# Patient Record
Sex: Male | Born: 2000 | Race: Black or African American | Hispanic: No | Marital: Single | Smoking: Never smoker
Health system: Southern US, Community
[De-identification: ages and names within clinical notes are randomized; demographics above are authoritative.]

## PROBLEM LIST (undated history)

## (undated) DIAGNOSIS — F840 Autistic disorder: Secondary | ICD-10-CM

---
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
## 2021-06-30 DIAGNOSIS — Y929 Unspecified place or not applicable: Secondary | ICD-10-CM | POA: Insufficient documentation

## 2021-06-30 DIAGNOSIS — R5383 Other fatigue: Secondary | ICD-10-CM | POA: Insufficient documentation

## 2021-06-30 DIAGNOSIS — S41122A Laceration with foreign body of left upper arm, initial encounter: Secondary | ICD-10-CM | POA: Insufficient documentation

## 2021-06-30 DIAGNOSIS — Z79899 Other long term (current) drug therapy: Secondary | ICD-10-CM | POA: Insufficient documentation

## 2021-06-30 DIAGNOSIS — Z639 Problem related to primary support group, unspecified: Secondary | ICD-10-CM | POA: Insufficient documentation

## 2021-06-30 DIAGNOSIS — Y939 Activity, unspecified: Secondary | ICD-10-CM | POA: Insufficient documentation

## 2021-06-30 DIAGNOSIS — Z9151 Personal history of suicidal behavior: Secondary | ICD-10-CM | POA: Insufficient documentation

## 2021-06-30 DIAGNOSIS — Z20822 Contact with and (suspected) exposure to covid-19: Secondary | ICD-10-CM | POA: Insufficient documentation

## 2021-06-30 DIAGNOSIS — F1290 Cannabis use, unspecified, uncomplicated: Secondary | ICD-10-CM | POA: Insufficient documentation

## 2021-06-30 DIAGNOSIS — F840 Autistic disorder: Secondary | ICD-10-CM | POA: Insufficient documentation

## 2021-06-30 DIAGNOSIS — X788XXA Intentional self-harm by other sharp object, initial encounter: Secondary | ICD-10-CM | POA: Insufficient documentation

## 2021-06-30 DIAGNOSIS — F4325 Adjustment disorder with mixed disturbance of emotions and conduct: Secondary | ICD-10-CM | POA: Insufficient documentation
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
## 2021-07-01 ENCOUNTER — Ambulatory Visit (HOSPITAL_COMMUNITY)
Admission: EM | Admit: 2021-07-01 | Discharge: 2021-07-01 | Disposition: A | Discharge status: Home / Self Care | Payer: No Payment, Other | Attending: Student | Admitting: Student

## 2021-07-01 DIAGNOSIS — F840 Autistic disorder: Secondary | ICD-10-CM | POA: Diagnosis not present

## 2021-07-01 DIAGNOSIS — F4325 Adjustment disorder with mixed disturbance of emotions and conduct: Secondary | ICD-10-CM

## 2021-07-01 DIAGNOSIS — Z20822 Contact with and (suspected) exposure to covid-19: Secondary | ICD-10-CM | POA: Diagnosis not present

## 2021-07-01 DIAGNOSIS — R5383 Other fatigue: Secondary | ICD-10-CM | POA: Diagnosis not present

## 2021-07-01 LAB — COMPREHENSIVE METABOLIC PANEL
ALT: 18 U/L (ref 0–44)
AST: 22 U/L (ref 15–41)
Albumin: 4.4 g/dL (ref 3.5–5.0)
Alkaline Phosphatase: 54 U/L (ref 38–126)
Anion gap: 11 (ref 5–15)
BUN: 13 mg/dL (ref 6–20)
CO2: 26 mmol/L (ref 22–32)
Calcium: 8.9 mg/dL (ref 8.9–10.3)
Chloride: 104 mmol/L (ref 98–111)
Creatinine, Ser: 0.9 mg/dL (ref 0.61–1.24)
GFR, Estimated: >60 mL/min (ref >60)
Glucose, Bld: 90 mg/dL (ref 70–99)
Potassium: 3.4 mmol/L — ABNORMAL LOW (ref 3.5–5.1)
Sodium: 141 mmol/L (ref 135–145)
Total Bilirubin: 1.2 mg/dL (ref 0.3–1.2)
Total Protein: 7.2 g/dL (ref 6.5–8.1)

## 2021-07-01 LAB — CBC WITH DIFFERENTIAL/PLATELET
Abs Immature Granulocytes: 0.01 10*3/uL (ref 0.00–0.07)
Basophils Absolute: 0 10*3/uL (ref 0.0–0.1)
Basophils Relative: 0 %
Eosinophils Absolute: 0.2 10*3/uL (ref 0.0–0.5)
Eosinophils Relative: 2 %
HCT: 42.6 % (ref 39.0–52.0)
Hemoglobin: 13.8 g/dL (ref 13.0–17.0)
Immature Granulocytes: 0 %
Lymphocytes Relative: 44 %
Lymphs Abs: 3.3 10*3/uL (ref 0.7–4.0)
MCH: 27.8 pg (ref 26.0–34.0)
MCHC: 32.4 g/dL (ref 30.0–36.0)
MCV: 85.7 fL (ref 80.0–100.0)
Monocytes Absolute: 0.6 10*3/uL (ref 0.1–1.0)
Monocytes Relative: 8 %
Neutro Abs: 3.4 10*3/uL (ref 1.7–7.7)
Neutrophils Relative %: 46 %
Platelets: 133 10*3/uL — ABNORMAL LOW (ref 150–400)
RBC: 4.97 MIL/uL (ref 4.22–5.81)
RDW: 14.1 % (ref 11.5–15.5)
WBC: 7.4 10*3/uL (ref 4.0–10.5)
nRBC: 0 % (ref 0.0–0.2)

## 2021-07-01 LAB — POCT URINE DRUG SCREEN - MANUAL ENTRY (I-SCREEN)
POC Amphetamine UR: NOT DETECTED
POC Buprenorphine (BUP): NOT DETECTED
POC Cocaine UR: NOT DETECTED
POC Secobarbital (BAR): NOT DETECTED

## 2021-07-01 LAB — POCT URINE DRUG SCREEN - MANUAL ENTRY (I-CUP)
POC Marijuana UR: POSITIVE — AB
POC Methadone UR: NOT DETECTED
POC Methamphetamine UR: NOT DETECTED
POC Morphine: NOT DETECTED
POC Oxazepam (BZO): NOT DETECTED
POC Oxycodone UR: NOT DETECTED

## 2021-07-01 LAB — LIPID PANEL
Cholesterol: 224 mg/dL — ABNORMAL HIGH (ref 0–200)
HDL: 55 mg/dL (ref >40)
LDL Cholesterol: 157 mg/dL — ABNORMAL HIGH (ref 0–99)
Total CHOL/HDL Ratio: 4.1 RATIO
Triglycerides: 59 mg/dL (ref <150)
VLDL: 12 mg/dL (ref 0–40)

## 2021-07-01 LAB — HEMOGLOBIN A1C
Hgb A1c MFr Bld: 5.6 % (ref 4.8–5.6)
Mean Plasma Glucose: 114.02 mg/dL

## 2021-07-01 LAB — TSH: TSH: 2.898 u[IU]/mL (ref 0.350–4.500)

## 2021-07-01 LAB — RESP PANEL BY RT-PCR (FLU A&B, COVID) ARPGX2
Influenza A by PCR: NEGATIVE
Influenza B by PCR: NEGATIVE
SARS Coronavirus 2 by RT PCR: NEGATIVE

## 2021-07-01 LAB — POC SARS CORONAVIRUS 2 AG: SARSCOV2ONAVIRUS 2 AG: NEGATIVE

## 2021-07-01 LAB — POC SARS CORONAVIRUS 2 AG -  ED: SARS Coronavirus 2 Ag: NEGATIVE

## 2021-07-01 MED ORDER — TETANUS-DIPHTH-ACELL PERTUSSIS 5-2.5-18.5 LF-MCG/0.5 IM SUSY
0.5000 mL | PREFILLED_SYRINGE | Freq: Once | INTRAMUSCULAR | Status: DC
  Filled 2021-07-01 (×3): qty 0.5

## 2021-07-01 MED ORDER — ALUM & MAG HYDROXIDE-SIMETH 200-200-20 MG/5ML PO SUSP: 30.0000 mL | ORAL | Status: DC | PRN

## 2021-07-01 MED ORDER — ACETAMINOPHEN 325 MG PO TABS: 650.0000 mg | ORAL_TABLET | Freq: Four times a day (QID) | ORAL | Status: DC | PRN

## 2021-07-01 MED ORDER — MAGNESIUM HYDROXIDE 400 MG/5ML PO SUSP: 30.0000 mL | Freq: Every day | ORAL | Status: DC | PRN

## 2021-07-01 MED ORDER — TETANUS-DIPHTHERIA TOXOIDS TD 5-2 LFU IM INJ
0.5000 mL | INJECTION | Freq: Once | INTRAMUSCULAR | Status: DC
  Filled 2021-07-01: qty 0.5

## 2021-07-01 MED ORDER — HYDROXYZINE HCL 25 MG PO TABS: 25.0000 mg | ORAL_TABLET | Freq: Three times a day (TID) | ORAL | Status: DC | PRN

## 2021-07-01 MED ORDER — POTASSIUM CHLORIDE CRYS ER 20 MEQ PO TBCR
20.0000 meq | EXTENDED_RELEASE_TABLET | Freq: Once | ORAL | Status: DC
  Filled 2021-07-01: qty 1

## 2021-07-01 MED ORDER — TRAZODONE HCL 50 MG PO TABS: 50.0000 mg | ORAL_TABLET | Freq: Every evening | ORAL | Status: DC | PRN

## 2021-07-01 NOTE — ED Notes (Signed)
Pt is sleeping in bed. No signs of distress noted. Respirations are even and unlabored. Will continue to monitor for safety. 

## 2021-07-01 NOTE — ED Notes (Signed)
Pt A&O x 4, presents with suicidal ideations after breakup with partner.  Pt admits to attempting to harm self with broken grill, scratches to Left forearm noted.  Pt labile.  Skin search completed.  Monitoring for safety.

## 2021-07-01 NOTE — BH Assessment (Signed)
Pt's aunt, [name], returned clinician's phone call. She states she was with pt yesterday but does not know what happened that would have resulted in him coming to the hospital. She provided the name Delray Alt, pt's aunt, and advised calling her at [phone] for information.

## 2021-07-01 NOTE — ED Notes (Signed)
Pt discharged with  AVS.  AVS reviewed prior to discharge.  Pt alert, oriented, and ambulatory.  Safety maintained. Pt will be going home via safe transport.  Aunt has been notified and pt is aware to provide aunt a call once he has arrived home.

## 2021-07-01 NOTE — Discharge Instructions (Addendum)
Be sure to follow up with the resources provided for outpatient psychiatry and counseling.   In the event of worsening symptoms call the crisis hotline, 911, and or go to the nearest emergency department for appropriate evaluation and treatment of symptoms.  Follow-up with your primary care provider for your medical issues, concerns and or health care needs.

## 2021-07-01 NOTE — ED Provider Notes (Signed)
FBC/OBS ASAP Discharge Summary  Date and Time: 07/01/2021 9:39 AM  Name: Frederick Cooper  MRN:  [id number]   Discharge Diagnoses:  Final diagnoses:  Adjustment disorder with mixed disturbance of emotions and conduct    Subjective: Frederick Cooper is a 20 year old male with documented past medical history of autism but no other apparent significant documented past psychiatric or medical, history, who presents to the [facility] behavioral health urgent care [facility]) voluntarily via Patent examiner. Patient states that he called the police earlier on the evening of 06/30/2021 because he was anxious and having "family issues".  Patient also reports that he has been having anxiety and stress related to a recent break-up with his girlfriend.  Patient also states that he got into a verbal altercation yesterday with his uncle who is his boss, because he did not want to go to work yesterday on 06/30/2021.  He reports that he called the police last night because he "wanted to talk to someone", but he does not initially provide further details regarding why police were called.  Upon further questioning, patient states that yesterday on 06/30/2021, he attempted suicide by cutting his left arm with a piece of metal that he broke in half that was from a grill at his home. He denies SI on exam currently at this time.    Stay Summary:Patient seen and re-examined face to face by this provider, chart reviewed and case discussed with Dr. Lucianne Muss. On evaluation patient is alert and oriented x4. His thought process is logical and speech is coherent. His mood is euthymic and affect is congruent. He denies having thoughts of wanting to hurt himself or others. He verbally contracts for safety and no self-harm behaviors. He reports that yesterday he was  upset after he had an altercation with his family (aunt and uncle) and his girlfriend. He does not provide details on what the altercation was about. He reports making  scratches to his arm and states that he was not trying to kill himself. He denies hearing voices or seeing things that other people cannot hear or see. He does not appear to be responding to internal or external stimuli. He reports sleeping good last night. He reports that his appetite is "alright." He states that he plans to return back home with his aunt and uncle and feels safe. He states that his aunt can pick him up from this facility. He has remained calm and cooperative on the unit without any disruptive, aggressive or self-harm behaviors. He is agreeable to following up with the [facility] outpatient for therapy. Patient provided with resources for outpatient psychiatry.   With the patient's consent, this provider contacted the patient's aunt Shanelle [phone] and uncle Ethelene Browns [phone] via telephone, no response. This provider has communicated with the nursing staff to have the patient family call for safety planning prior to the patient discharging.    With the patient's consent, I spoke to his aunt Willodean Rosenthal [phone]  who states that she has no concerns with the patient being discharged to return back to his uncle's house. She states that his uncle is currently working and is unable to answer his phone. She reports that she is currently at work and is unable to pick the patient. She states that she is agreeable to the facility providing transportation to transport the patient home using our transportation services. She denies that the patient has access to any weapons in home, including guns and knives.  Safety Plan Frederick Cooper will reach out to aunt Hughes Better, call 911 or call mobile crisis if condition worsens or if suicidal thoughts become active Patients' will follow up with [facility] for outpatient psychiatric services (therapy/medication management).  The suicide prevention education provided includes the following: Suicide risk  factors Suicide prevention and interventions National Suicide Hotline telephone number [facility] assessment telephone number [facility] Emergency Assistance 911 [facility] and/or Residential Mobile Crisis Unit telephone number Request made of family/significant other to:  Shanelle [phone]   Remove weapons (e.g., guns, rifles, knives), all items previously/currently identified as safety concern.   Remove drugs/medications (over the counter, prescriptions, illicit drugs), all items previously/currently identified as a safety concern.    Total Time spent with patient: 20 minutes  Past Psychiatric History: hx of autism  Past Medical History:  Past Medical History:  Diagnosis Date   Autism    No past surgical history on file. Family History: No family history on file. Family Psychiatric History:  Social History:  Social History   Substance and Sexual Activity  Alcohol Use No     Social History   Substance and Sexual Activity  Drug Use No    Social History   Socioeconomic History   Marital status: Single    Spouse name: Not on file   Number of children: Not on file   Years of education: Not on file   Highest education level: Not on file  Occupational History   Not on file  Tobacco Use   Smoking status: Never   Smokeless tobacco: Never  Substance and Sexual Activity   Alcohol use: No   Drug use: No   Sexual activity: Not on file  Other Topics Concern   Not on file  Social History Narrative   Not on file   Social Determinants of Health   Financial Resource Strain: Not on file  Food Insecurity: Not on file  Transportation Needs: Not on file  Physical Activity: Not on file  Stress: Not on file  Social Connections: Not on file   SDOH:  SDOH Screenings   Alcohol Screen: Not on file  Depression (PHQ2-9): Low Risk    PHQ-2 Score: 3  Financial Resource Strain: Not on file  Food Insecurity: Not on file  Housing: Not on file   Physical Activity: Not on file  Social Connections: Not on file  Stress: Not on file  Tobacco Use: Low Risk    Smoking Tobacco Use: Never   Smokeless Tobacco Use: Never  Transportation Needs: Not on file    Tobacco Cessation:  Prescription not provided because: pt declined   Current Medications:  Current Facility-Administered Medications  Medication Dose Route Frequency Provider Last Rate Last Admin   acetaminophen (TYLENOL) tablet 650 mg  650 mg Oral Q6H PRN Jaclyn Shaggy, PA-C       alum & mag hydroxide-simeth (MAALOX/MYLANTA) 200-200-20 MG/5ML suspension 30 mL  30 mL Oral Q4H PRN Melbourne Abts W, PA-C       hydrOXYzine (ATARAX/VISTARIL) tablet 25 mg  25 mg Oral TID PRN Melbourne Abts W, PA-C       magnesium hydroxide (MILK OF MAGNESIA) suspension 30 mL  30 mL Oral Daily PRN Ladona Ridgel, Cody W, PA-C       potassium chloride SA (KLOR-CON) CR tablet 20 mEq  20 mEq Oral Once Ladona Ridgel, Cody W, PA-C       Tdap (BOOSTRIX) injection 0.5 mL  0.5 mL Intramuscular Once Jaclyn Shaggy, PA-C  traZODone (DESYREL) tablet 50 mg  50 mg Oral QHS PRN Jaclyn Shaggy, PA-C       No current outpatient medications on file.    PTA Medications: (Not in a hospital admission)   Musculoskeletal  Strength & Muscle Tone: within normal limits Gait & Station: normal Patient leans: N/A  Psychiatric Specialty Exam  Presentation  General Appearance: Appropriate for Environment  Eye Contact:Fair  Speech:Clear and Coherent  Speech Volume:Normal   Mood and Affect  Mood:Euthymic  Affect:Congruent   Thought Process  Thought Processes:Coherent; Goal Directed  Descriptions of Associations:Intact  Orientation:Full (Time, Place and Person)  Thought Content:Logical  Diagnosis of Schizophrenia or Schizoaffective disorder in past: No    Hallucinations:Hallucinations: None  Ideas of Reference:None  Suicidal Thoughts:Suicidal Thoughts: No  Homicidal Thoughts:Homicidal Thoughts: No   Sensorium   Memory:Immediate Fair; Recent Fair; Remote Fair  Judgment:Fair  Insight:Fair   Executive Functions  Concentration:Fair  Attention Span:Fair  Recall:Fair  Fund of Knowledge:Fair  Language:Fair   Psychomotor Activity  Psychomotor Activity:Psychomotor Activity: Normal   Assets  Assets:Communication Skills; Desire for Improvement; Housing; Health and safety inspector; Leisure Time; Intimacy; Physical Health; Social Support   Sleep  Sleep:Sleep: Fair Number of Hours of Sleep: 11   Nutritional Assessment (For OBS and FBC admissions only) Has the patient had a weight loss or gain of 10 pounds or more in the last 3 months?: -- (Patient states he is not sure.) Has the patient had a decrease in food intake/or appetite?: Yes Does the patient have dental problems?: No Does the patient have eating habits or behaviors that may be indicators of an eating disorder including binging or inducing vomiting?: No Has the patient recently lost weight without trying?: 2.0 Has the patient been eating poorly because of a decreased appetite?: 1 Malnutrition Screening Tool Score: 3 Nutritional Assessment Referrals: Refer to Primary Care Provider    Physical Exam  Physical Exam Constitutional:      Appearance: Normal appearance.  HENT:     Head: Atraumatic.     Nose: Nose normal.  Eyes:     Conjunctiva/sclera: Conjunctivae normal.  Cardiovascular:     Rate and Rhythm: Normal rate.  Pulmonary:     Effort: Pulmonary effort is normal.  Musculoskeletal:     Cervical back: Normal range of motion.  Neurological:     Mental Status: He is alert and oriented to person, place, and time.   Review of Systems  Constitutional: Negative.   HENT: Negative.    Eyes: Negative.   Respiratory: Negative.    Cardiovascular: Negative.   Gastrointestinal: Negative.   Genitourinary: Negative.   Musculoskeletal: Negative.   Skin: Negative.   Neurological: Negative.   Endo/Heme/Allergies:  Negative.   Blood pressure 120/65, pulse 67, temperature (!) 97.5 F (36.4 C), temperature source Oral, resp. rate 18, SpO2 99 %. There is no height or weight on file to calculate BMI.  Demographic Factors:  Male and Low socioeconomic status  Loss Factors: Financial problems/change in socioeconomic status  Historical Factors: Impulsivity  Risk Reduction Factors:   Sense of responsibility to family, Employed, Living with another person, especially a relative, and Positive social support  Continued Clinical Symptoms:  Previous Psychiatric Diagnoses and Treatments  Cognitive Features That Contribute To Risk:  None    Suicide Risk:  Minimal: No identifiable suicidal ideation.  Patients presenting with no risk factors but with morbid ruminations; may be classified as minimal risk based on the severity of the depressive symptoms  Plan Of Care/Follow-up recommendations:  Activity:  as tolerated   Disposition: Discharge to home.  Follow-up Information     Go to  [facility].   Specialty: Urgent Care Why: Open access walk-in hours Monday - Thursday 8 am to 11 am for medication management and therapy or call to schedule an appointment. Contact information: [phone] [place] [address] [phone]                Layla Barter, NP 07/01/2021, 9:39 AM

## 2021-07-01 NOTE — ED Provider Notes (Addendum)
Behavioral Health Admission H&P [name])  Date: 07/01/21 Patient Name: Frederick Cooper MRN: [id number] Chief Complaint:  Chief Complaint  Patient presents with   Adjustment Disorder      Diagnoses:  Final diagnoses:  Adjustment disorder with mixed disturbance of emotions and conduct    HPI: Frederick Cooper is a 20 year old male with documented past medical history of autism but no other apparent significant documented past psychiatric or medical, history, who presents to the [facility] behavioral health urgent care [facility]) voluntarily via Event organ[employer]. Patient states that he called the police earlier on the evening of 06/30/2021 because he was anxious and having "family issues".  Patient also reports that he has been having anxiety and stress related to a recent break-up with his girlfriend.  Patient also states that he got into a verbal altercation yesterday with his uncle who is his boss, because he did not want to go to work yesterday on 06/30/2021.  He reports that he called the police last night because he "wanted to talk to someone", but he does not initially provide further details regarding why police were called.  Upon further questioning, patient states that yesterday on 06/30/2021, he attempted suicide by cutting his left arm with a piece of metal that he broke in half that was from a grill at his home. He denies SI on exam currently at this time.  Patient states that he is not sure if his tetanus shot is up-to-date.  He endorses history of 1 additional past suicide attempt at the age of 35 in which he states that he overdosed on prescription medication/sleep medication mixed with alcohol at that time. Prior to this cutting incident, patient states that he had not engaged in self-injurious behavior via cutting since the age of 24.  He denies history of intentionally burning himself.  He denies HI, AVH, paranoia, or delusions.  Patient describes her sleep as poor, but then  states that he sleeps about 11 to 12 hours per night.  He denies anhedonia but does endorse feelings of guilt, hopelessness, and worthlessness.  He endorses fatigue, poor focus/concentration, and decreased appetite.  Patient is unsure if he has had any weight changes over the past few months.  Per chart review, patient was evaluated by TTS on 03/31/2018 When patient presented to [facility] emergency department for psychiatric reasons at that time and patient was psychiatrically cleared at that time with recommendation for outpatient psychiatric follow-up.  Patient states that he is not taking any psychotropic medications or any home medications at this time.  He reports that he has been psychiatrically hospitalized multiple times in the past in which he states his last psychiatric hospitalization was in [place] at the age of 43.  I am unable to find any record of any past psychiatric hospitalizations per my chart review.  Patient states he does not have an outpatient psychiatrist or therapist at this time.  Patient reports that he lives in [place] with his uncle, the mother of his uncle's daughter, and uncle's 32-year-old daughter.  He denies access to firearms or weapons.  He denies alcohol or tobacco/nicotine use.  He endorses smoking an unknown amount of marijuana daily.  He reports that his last marijuana use was on 06/30/2021, in which he states he smoked 3 joints at that time.  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  With patient's consent, myself and [name], LMFT attempted to obtain collateral information by calling  patient's aunt Frederick Cooper: [phone]) and patient's uncle Frederick Cooper: [phone]), but were unsuccessful with reaching either person via phone.  PHQ 2-9:     Total Time spent with patient: 30 minutes  Musculoskeletal  Strength & Muscle Tone: within normal limits Gait & Station: normal Patient leans: N/A  Psychiatric Specialty  Exam  Presentation General Appearance: Appropriate for Environment; Fairly Groomed  Eye Contact:Fair; Fleeting  Speech:Garbled  Speech Volume:Normal  Handedness:No data recorded  Mood and Affect  Mood:Depressed; Anxious  Affect:Congruent   Thought Process  Thought Processes:Coherent; Goal Directed; Linear  Descriptions of Associations:Intact  Orientation:Full (Time, Place and Person)  Thought Content:Logical; WDL    Hallucinations:Hallucinations: None  Ideas of Reference:None  Suicidal Thoughts:Suicidal Thoughts: -- (Patient denies SI currently on exam, but states that he attempted suicide yesterday on 06/30/21 by cutting his left forearm (see HPI for details).)  Homicidal Thoughts:Homicidal Thoughts: No   Sensorium  Memory:Immediate Fair; Recent Fair; Remote Fair  Judgment:Fair  Insight:Shallow   Executive Functions  Concentration:Fair  Attention Span:Fair  [place]   Psychomotor Activity  Psychomotor Activity:Psychomotor Activity: Normal   Assets  Assets:Communication Skills; Desire for Improvement; Financial Resources/Insurance; Housing; Leisure Time; Physical Health; Resilience; Social Support; Vocational/Educational   Sleep  Sleep:Sleep: Poor Number of Hours of Sleep: 11   Nutritional Assessment (For OBS and [facility]BC admissions only) Has the patient had a weight loss or gain of 10 pounds or more in the last 3 months?: -- (Patient states he is not sure.) Has the patient had a decrease in food intake/or appetite?: Yes Does the patient have dental problems?: No Does the patient have eating habits or behaviors that may be indicators of an eating disorder including binging or inducing vomiting?: No Has the patient recently lost weight without trying?: 2.0 Has the patient been eating poorly because of a decreased appetite?: 1 Malnutrition Screening Tool Score: 3 Nutritional Assessment Referrals: Refer to  Primary Care Provider    Physical Exam Vitals reviewed.  Constitutional:      General: He is not in acute distress.    Appearance: He is not ill-appearing, toxic-appearing or diaphoretic.  HENT:     Head: Normocephalic and atraumatic.     Right Ear: External ear normal.     Left Ear: External ear normal.     Nose: Nose normal.  Eyes:     General:        Right eye: No discharge.        Left eye: No discharge.     Conjunctiva/sclera: Conjunctivae normal.  Cardiovascular:     Rate and Rhythm: Normal rate.  Pulmonary:     Effort: Pulmonary effort is normal. No respiratory distress.  Musculoskeletal:        General: Normal range of motion.     Cervical back: Normal range of motion.  Skin:    Comments: Multiple superficial lacerations noted on patient's left forearm, with a few of these lesions appearing to some slight bleeding. No signs of surrounding inflammation, drainage, or infection noted with these lacerations.  Neurological:     General: No focal deficit present.     Mental Status: He is alert and oriented to person, place, and time.     Comments: No tremor noted.   Psychiatric:        Attention and Perception: He does not perceive auditory or visual hallucinations.        Mood and Affect: Mood is anxious and depressed.  Behavior: Behavior is not agitated, slowed, aggressive, withdrawn, hyperactive or combative. Behavior is cooperative.        Thought Content: Thought content is not paranoid or delusional. Thought content does not include homicidal ideation.     Comments: Affect mood congruent. Patient states that yesterday on 06/30/2021, he attempted suicide by cutting his left arm with a piece of metal that he broke in half that was from a grill at his home. He denies SI on exam currently at this time.     Review of Systems  Constitutional:  Positive for malaise/fatigue. Negative for chills, diaphoresis and fever.       Patient unsure of weight changes.   HENT:   Negative for congestion.   Respiratory:  Negative for cough and shortness of breath.   Cardiovascular:  Negative for chest pain and palpitations.  Gastrointestinal:  Negative for abdominal pain, constipation, diarrhea, nausea and vomiting.  Musculoskeletal:  Negative for joint pain and myalgias.  Neurological:  Negative for dizziness, seizures and headaches.  Psychiatric/Behavioral:  Positive for depression, substance abuse and suicidal ideas. Negative for hallucinations and memory loss. The patient is nervous/anxious and has insomnia.   All other systems reviewed and are negative.  Vitals: Blood pressure 120/72, pulse 63, temperature 98.5 F (36.9 C), temperature source Oral, resp. rate 18, SpO2 98 %. There is no height or weight on file to calculate BMI.  Past Psychiatric History: See HPI   Is the patient at risk to self? Yes  Has the patient been a risk to self in the past 6 months? No .    Has the patient been a risk to self within the distant past? Yes   Is the patient a risk to others? No   Has the patient been a risk to others in the past 6 months? No   Has the patient been a risk to others within the distant past? No   Past Medical History:  Past Medical History:  Diagnosis Date   Autism    No past surgical history on file.  Family History: No family history on file.  Social History:  Social History   Socioeconomic History   Marital status: Single    Spouse name: Not on file   Number of children: Not on file   Years of education: Not on file   Highest education level: Not on file  Occupational History   Not on file  Tobacco Use   Smoking status: Never   Smokeless tobacco: Never  Substance and Sexual Activity   Alcohol use: No   Drug use: No   Sexual activity: Not on file  Other Topics Concern   Not on file  Social History Narrative   Not on file   Social Determinants of Health   Financial Resource Strain: Not on file  Food Insecurity: Not on file   Transportation Needs: Not on file  Physical Activity: Not on file  Stress: Not on file  Social Connections: Not on file  Intimate Partner Violence: Not on file    SDOH:  SDOH Screenings   Alcohol Screen: Not on file  Depression (PHQ2-9): Not on file  Financial Resource Strain: Not on file  Food Insecurity: Not on file  Housing: Not on file  Physical Activity: Not on file  Social Connections: Not on file  Stress: Not on file  Tobacco Use: Low Risk    Smoking Tobacco Use: Never   Smokeless Tobacco Use: Never  Transportation Needs: Not on file  Last Labs:  No visits with results within 6 Month(s) from this visit.  Latest known visit with results is:  Admission on 06/01/2018, Discharged on 06/02/2018  Component Date Value Ref Range Status   WBC 06/01/2018 13.5  4.5 - 13.5 K/uL Final   RBC 06/01/2018 4.85  3.80 - 5.70 MIL/uL Final   Hemoglobin 06/01/2018 13.3  12.0 - 16.0 g/dL Final   HCT 06/01/2018 42.1  36.0 - 49.0 % Final   MCV 06/01/2018 86.8  78.0 - 98.0 fL Final   MCH 06/01/2018 27.4  25.0 - 34.0 pg Final   MCHC 06/01/2018 31.6  31.0 - 37.0 g/dL Final   RDW 06/01/2018 13.5  11.4 - 15.5 % Final   Platelets 06/01/2018 164  150 - 400 K/uL Final   Neutrophils Relative % 06/01/2018 56  % Final   Neutro Abs 06/01/2018 7.5  1.7 - 8.0 K/uL Final   Lymphocytes Relative 06/01/2018 34  % Final   Lymphs Abs 06/01/2018 4.6  1.1 - 4.8 K/uL Final   Monocytes Relative 06/01/2018 8  % Final   Monocytes Absolute 06/01/2018 1.0  0.2 - 1.2 K/uL Final   Eosinophils Relative 06/01/2018 2  % Final   Eosinophils Absolute 06/01/2018 0.2  0.0 - 1.2 K/uL Final   Basophils Relative 06/01/2018 0  % Final   Basophils Absolute 06/01/2018 0.1  0.0 - 0.1 K/uL Final   Immature Granulocytes 06/01/2018 0  % Final   Abs Immature Granulocytes 06/01/2018 0.1  0.0 - 0.1 K/uL Final   Performed at [facility], [place] [address] [place], [place] [address]   Sodium 06/01/2018 138  135 - 145 mmol/L  Final   Potassium 06/01/2018 3.1 (A) 3.5 - 5.1 mmol/L Final   Chloride 06/01/2018 102  98 - 111 mmol/L Final   CO2 06/01/2018 22  22 - 32 mmol/L Final   Glucose, Bld 06/01/2018 132 (A) 70 - 99 mg/dL Final   BUN 06/01/2018 14  4 - 18 mg/dL Final   Creatinine, Ser 06/01/2018 1.00  0.50 - 1.00 mg/dL Final   Calcium 06/01/2018 8.6 (A) 8.9 - 10.3 mg/dL Final   GFR calc non Af Amer 06/01/2018 NOT CALCULATED  >60 mL/min Final   GFR calc Af Amer 06/01/2018 NOT CALCULATED  >60 mL/min Final   Comment: (NOTE) The eGFR has been calculated using the CKD EPI equation. This calculation has not been validated in all clinical situations. eGFR's persistently <60 mL/min signify possible Chronic Kidney Disease.    Anion gap 06/01/2018 14  5 - 15 Final   Performed at [place] [address] [place], [place] [address]   ABO/RH(D) 06/01/2018 A POS   Final   Antibody Screen 06/01/2018 NEG   Final   Sample Expiration 06/01/2018    Final                   Value:06/04/2018 Performed at [facility], [place] [address] [place], [place] [address]    ABO/RH(D) 06/01/2018    Final                   Value:A POS Performed at [facility], [place] [address], [place], [place] [address]     Allergies: Patient has no known allergies.  PTA Medications: (Not in a hospital admission)   Medical Decision Making  Patient is a 20 year old male with past psychiatric and medical history as stated above who presents to the [facility] via law  enforcement for reported suicide attempt via cutting his left forearm on 06/30/2021 as well as increased anxiety related to family issues and a recent break-up with his girlfriend, as well as a reported verbal altercation that he had with his uncle on 06/30/2021.  Based on patient's current presentation including the details noted above as well as inability to obtain collateral information from family members at this time, believe that the patient is a potential threat to  himself at this time and recommend continuous assessment for the patient at this time.    Recommendations  Based on my evaluation the patient does not appear to have an emergency medical condition.  Patient will be placed in [facility] continuous assessment for further stabilization and treatment.  Patient will be reevaluated by the treatment team on 06/30/2021 and disposition to be determined at that time.   Due to patient being unsure of tetanus immunization status and due to patient reporting that he cut his left forearm with a piece of metal from a grill, order for tetanus (Boostrix) vaccine placed.  Labs ordered and reviewed:  -PCR Flu A&B, COVID: Negative  -UDS: Patient unable to provide urine specimen at this time.  Recommend that the treatment team encouraged the patient to provide urine specimen on 07/01/2021  -CBC with differential: Platelet count slightly reduced at 133 K/uL.  CBC otherwise unremarkable  -CMP: Mild hypokalemia noted with potassium slightly reduced at 3.4 mmol/L.  CMP otherwise unremarkable.  -Hemoglobin A1c: Within normal limits at 5.6%  -TSH: Within normal limits  -Lipid panel: Total cholesterol elevated at 224 mg/dL, LDL cholesterol elevated at 157 mg/dL.  Lipid panel otherwise unremarkable.  Patient reports he is not taking any other medications at this time.  Vistaril 25 mg p.o. 3 times daily as needed ordered for anxiety. Trazodone 50 mg p.o. at bedtime as needed ordered for sleep. Patient educated on side effect of Vistaril and trazodone.  One time dose of PO potassium 20 mEq ordered to address patient's mild hypokalemia.  Prescilla Sours, PA-C 07/01/21  12:55 AM

## 2021-07-01 NOTE — ED Notes (Signed)
Pt given lunch

## 2021-07-04 ENCOUNTER — Other Ambulatory Visit: Payer: Self-pay

## 2021-07-04 ENCOUNTER — Ambulatory Visit (HOSPITAL_COMMUNITY)
Admission: EM | Admit: 2021-07-04 | Discharge: 2021-07-05 | Disposition: A | Discharge status: Home / Self Care | Payer: No Payment, Other | Attending: Nurse Practitioner | Admitting: Nurse Practitioner

## 2021-07-04 DIAGNOSIS — Z5900 Homelessness unspecified: Secondary | ICD-10-CM | POA: Insufficient documentation

## 2021-07-04 DIAGNOSIS — F4323 Adjustment disorder with mixed anxiety and depressed mood: Secondary | ICD-10-CM | POA: Diagnosis not present

## 2021-07-04 NOTE — Progress Notes (Signed)
   07/04/21 2012  Patient Reported Information  How Did You Hear About [place]? Legal System  What Is the Reason for Your Visit/Call Today? Pt reports, he has fleeting suicidal ideations daily with a plan to cutting his wrist.  How Long Has This Been Causing You Problems? <Week  What Do You Feel Would Help You the Most Today? Treatment for Depression or other mood problem;Social Support  Have You Recently Had Any Thoughts About Hurting Yourself? Yes  Are You Planning to Commit Suicide/Harm Yourself At This time? Yes  Have you Recently Had Thoughts About Hurting Someone [name]? No  Are You Planning To Harm Someone At This Time? No  Have You Used Any Alcohol or Drugs in the Past 24 Hours? Yes  What Did You Use and How Much? Marijuana.  Do You Currently Have a Therapist/Psychiatrist? No  [facility] Screening Triage Referral Assessment  Type of Contact Face-to-Face  Location of Assessment [facility] Assessment Services  Provider location [facility] Assessment Services  Collateral Involvement Clinician attempted to contact pt's legal guardian/uncle, [name], [phone] and pt's aunt, [name], at [phone] was both unsuccessful.  If Minor and Not Living with Parent(s), Who has Custody? N/A  Patient Determined To Be At Risk for Harm To Self or Others Based on Review of Patient Reported Information or Presenting Complaint? Yes, for Self-Harm  Does Patient Present under Involuntary Commitment? No  [place] of Residence [place]  Patient Currently Receiving the Following Services: Not Receiving Services  Determination of Need Urgent (48 hours)  Options For Referral [facility];Inpatient Hospitalization   Pratik Dalziel is a 20 year old male who presents depressed with fleeting suicidal ideations. Pt reports, having plan of cutting his wrist. Pt reports, he recently broke up with his girlfriend and was kicked out of his uncle's house for no going to work. Pt reports, he can't contract for safety.     Determination of need: Urgent.   Redmond Pulling, MS, [facility], [facility] Triage Specialist [phone]

## 2021-07-04 NOTE — ED Notes (Signed)
Called [employer] for transport. 

## 2021-07-04 NOTE — BH Assessment (Signed)
Comprehensive Clinical Assessment (CCA) Note  07/04/2021 Frederick Cooper [id number]  Disposition: Frederick Cooper, PMHNP recommends pt is psych cleared.   Flowsheet Row ED from 07/04/2021 in [facility] ED from 07/01/2021 in [facility]  C-SSRS RISK CATEGORY High Risk High Risk      The patient demonstrates the following risk factors for suicide: Chronic risk factors for suicide include: N/A and previous suicide attempts Adjustment disorder with mixed disturbance of emotions and conduct . Acute risk factors for suicide include: family or marital conflict and unemployment. Protective factors for this patient include:  None . Considering these factors, the overall suicide risk at this point appears to be high. Patient is not appropriate for outpatient follow up.  Frederick Cooper is a 20 year old male who presents voluntary and unaccompanied to [facility]. Clinician asked the pt, "what brought you to the hospital?" Pt reports, he's depressed, feeling like a worse person from his family and girlfriend. Pt's reports the following stressors: he recently broke up with his girlfriend, last Thursday he didn't to go to work, he was fired by his uncle and kicked out of the house. Pt reports, he's been homeless staying outside. 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 Pt reports, he moved from [place] this summer to live with his (paternal) uncle because he wasn't getting along with his mother. Pt reports, he's been having fleeting suicidal ideations everyday with a plan to take a knife and cut his wrist. Pt reports, he called his grandmother and expressed he wanted to get better. Pt reports, last week he used a metal piece of a drill to cut his left forearm. Pt denies, HI, AVH, access to weapons.    Pt reports, he's not smoked marijuana "in a minute." Pt's UDS is pending. Pt denies, being linked to OPT resources (medication management and/or counseling.) Pt  reports, previous inpatient admissions.   Pt presents alert with slurred speech at times. Pt's mood was depressed, anxious. Pt's affect was flat. Pt's insight was lacking. Pt's judgement was poor. Pt reports, if discharged he would hurt himself. Clinician discussed the three possible dispositions (discharged with OPT resources, observe/reassess by psychiatry or inpatient treatment) in detail.   Diagnosis: Adjustment Disorder with mixed disturbance of emotions and conduct.   *Clinician attempted to call pt's legal guardian/uncle, Frederick Cooper, [phone] clinician was unable to leave a HIPPA voice message.  Pt consented for clinician to aunt his aunt Frederick Cooper, [phone] however the call was unsuccessful, clinician received the message: "call can't be completed as this time please try your call again later."*  Chief Complaint: No chief complaint on file.  Visit Diagnosis:     CCA Screening, Triage and Referral (STR)  Patient Reported Information How did you hear about [place]? Legal System  What Is the Reason for Your Visit/Call Today? Pt reports, he has fleeting suicidal ideations daily with a plan to cutting his wrist.  How Long Has This Been Causing You Problems? <Week  What Do You Feel Would Help You the Most Today? Treatment for Depression or other mood problem; Social Support   Have You Recently Had Any Thoughts About Hurting Yourself? Yes  Are You Planning to Commit Suicide/Harm Yourself At This time? Yes   Have you Recently Had Thoughts About Hurting Someone Frederick Cooper? No  Are You Planning to Harm Someone at This Time? No  Explanation: No data recorded  Have You Used Any Alcohol or Drugs in the Past 24 Hours? Yes  How Long Ago Did You Use Drugs or Alcohol? No data recorded What Did You Use and How Much? Marijuana.   Do You Currently Have a Therapist/Psychiatrist? No  Name of Therapist/Psychiatrist: No data recorded  Have You Been Recently Discharged From Any  Office Practice or Programs? No  Explanation of Discharge From Practice/Program: No data recorded    CCA Screening Triage Referral Assessment Type of Contact: Face-to-Face  Telemedicine Service Delivery:   Is this Initial or Reassessment? No data recorded Date Telepsych consult ordered in CHL:  No data recorded Time Telepsych consult ordered in CHL:  No data recorded Location of Assessment: [facility] Assessment Services  Provider Location: [facility] Assessment Services   Collateral Involvement: Clinician attempted to contact pt's legal guardian/uncle, Frederick Cooper, [phone] and pt's aunt, Frederick Cooper, at [phone] was both unsuccessful.   Does Patient Have a Automotive engineer Guardian? No data recorded Name and Contact of Legal Guardian: No data recorded If Minor and Not Living with Parent(s), Who has Custody? N/A  Is CPS involved or ever been involved? -- (Unknown)  Is APS involved or ever been involved? -- (Unknown)   Patient Determined To Be At Risk for Harm To Self or Others Based on Review of Patient Reported Information or Presenting Complaint? Yes, for Self-Harm  Method: No data recorded Availability of Means: No data recorded Intent: No data recorded Notification Required: No data recorded Additional Information for Danger to Others Potential: No data recorded Additional Comments for Danger to Others Potential: No data recorded Are There Guns or Other Weapons in Your Home? No data recorded Types of Guns/Weapons: No data recorded Are These Weapons Safely Secured?                            No data recorded Who Could Verify You Are Able To Have These Secured: No data recorded Do You Have any Outstanding Charges, Pending Court Dates, Parole/Probation? No data recorded Contacted To Inform of Risk of Harm To Self or Others: -- (N/A)    Does Patient Present under Involuntary Commitment? No  IVC Papers Initial File Date: No data recorded  [place] of  Residence: [place]   Patient Currently Receiving the Following Services: Not Receiving Services   Determination of Need: Urgent (48 hours)   Options For Referral: [facility]; Inpatient Hospitalization     CCA Biopsychosocial Patient Reported Schizophrenia/Schizoaffective Diagnosis in Past: No   Strengths: Pt is employed. He is polite. He likes to listen to music with his girlfriend.   Mental Health Symptoms Depression:   Difficulty Concentrating; Irritability; Tearfulness; Hopelessness; Worthlessness; Fatigue; Increase/decrease in appetite   Duration of Depressive symptoms:  Duration of Depressive Symptoms: Less than two weeks   Mania:   None   Anxiety:    Worrying; Irritability; Fatigue   Psychosis:   None   Duration of Psychotic symptoms:    Trauma:   None   Obsessions:   None   Compulsions:   None   Inattention:   None   Hyperactivity/Impulsivity:   Feeling of restlessness   Oppositional/Defiant Behaviors:   None   Emotional Irregularity:   Potentially harmful impulsivity   Other Mood/Personality Symptoms:   None noted    Mental Status Exam Appearance and self-care  Stature:   Small   Weight:   Average weight   Clothing:   Casual; Age-appropriate   Grooming:   Normal   Cosmetic use:   None  Posture/gait:   Normal   Motor activity:   Not Remarkable   Sensorium  Attention:   Normal   Concentration:   Normal   Orientation:   X5   Recall/memory:   Normal (UTA)   Affect and Mood  Affect:   Flat   Mood:   Anxious   Relating  Eye contact:   Normal   Facial expression:   Responsive   Attitude toward examiner:   Cooperative   Thought and Language  Speech flow:  Slurred   Thought content:   Appropriate to Mood and Circumstances   Preoccupation:   None   Hallucinations:   None   Organization:  No data recorded  [employer] of Knowledge:   Fair   Intelligence:   Needs  investigation   Abstraction:   Normal   Judgement:   Normal   Reality Testing:   Distorted   Insight:   Gaps   Decision Making:   Impulsive   Social Functioning  Social Maturity:   Impulsive   Social Judgement:   Naive   Stress  Stressors:   Family conflict; Housing; Relationship; Transitions   Coping Ability:   Deficient supports; Overwhelmed   Skill Deficits:   Decision making; Self-control   Supports:   Support needed     Religion: Religion/Spirituality Are You A Religious Person?: No  Leisure/Recreation: Leisure / Recreation Do You Have Hobbies?: Yes Leisure and Hobbies: Chill, smoke, talk girlfriend on the phone or see her.  Exercise/Diet: Exercise/Diet Do You Exercise?:  (Pushups.) Have You Gained or Lost A Significant Amount of Weight in the Past Six Months?: No Do You Follow a Special Diet?: No Do You Have Any Trouble Sleeping?: Yes Explanation of Sleeping Difficulties: Pt reports, he went to sleep from 3a-5a (between 5 or 6 hours.)   CCA Employment/Education Employment/Work Situation: Employment / Work Situation Employment Situation: Unemployed Work Stressors: Pt reports, he worked and lives his uncle (legal guardian) but was fired (by his uncle) last Thursday beacuse he didn't want to go to work that day. Has Patient ever Been in the [employer]?: No  Education: Education Is Patient Currently Attending School?: No Last Grade Completed: 10 Did You Attend College?: No   CCA Family/Childhood History Family and Relationship History: Family history Marital status: Single Does patient have children?: No  Childhood History:  Childhood History By whom was/is the patient raised?: Other (Comment) (UTA) Did patient suffer any verbal/emotional/physical/sexual abuse as a child?: No Has patient ever been sexually abused/assaulted/raped as an adolescent or adult?: No Witnessed domestic violence?: No Has patient been affected by domestic  violence as an adult?:  (NA)  Child/Adolescent Assessment:     CCA Substance Use Alcohol/Drug Use: Alcohol / Drug Use Pain Medications: See MAR Prescriptions: See MAR Over the Counter: See MAR     ASAM's:  Six Dimensions of Multidimensional Assessment  Dimension 1:  Acute Intoxication and/or Withdrawal Potential:      Dimension 2:  Biomedical Conditions and Complications:      Dimension 3:  Emotional, Behavioral, or Cognitive Conditions and Complications:     Dimension 4:  Readiness to Change:     Dimension 5:  Relapse, Continued use, or Continued Problem Potential:     Dimension 6:  Recovery/Living Environment:     ASAM Severity Score:    ASAM Recommended Level of Treatment:     Substance use Disorder (SUD)    Recommendations for Services/Supports/Treatments:    Discharge Disposition:  DSM5 Diagnoses: There are no problems to display for this patient.    Referrals to Alternative Service(s): Referred to Alternative Service(s):   Place:   Date:   Time:    Referred to Alternative Service(s):   Place:   Date:   Time:    Referred to Alternative Service(s):   Place:   Date:   Time:    Referred to Alternative Service(s):   Place:   Date:   Time:     Redmond Pulling, [facility] Comprehensive Clinical Assessment (CCA) Screening, Triage and Referral Note  07/04/2021 Frederick Cooper [id number]  Chief Complaint: No chief complaint on file.  Visit Diagnosis:   Patient Reported Information How did you hear about [place]? Legal System  What Is the Reason for Your Visit/Call Today? Pt reports, he has fleeting suicidal ideations daily with a plan to cutting his wrist.  How Long Has This Been Causing You Problems? <Week  What Do You Feel Would Help You the Most Today? Treatment for Depression or other mood problem; Social Support   Have You Recently Had Any Thoughts About Hurting Yourself? Yes  Are You Planning to Commit Suicide/Harm Yourself At This time? Yes   Have  you Recently Had Thoughts About Hurting Someone Frederick Cooper? No  Are You Planning to Harm Someone at This Time? No  Explanation: No data recorded  Have You Used Any Alcohol or Drugs in the Past 24 Hours? Yes  How Long Ago Did You Use Drugs or Alcohol? No data recorded What Did You Use and How Much? Marijuana.   Do You Currently Have a Therapist/Psychiatrist? No  Name of Therapist/Psychiatrist: No data recorded  Have You Been Recently Discharged From Any Office Practice or Programs? No  Explanation of Discharge From Practice/Program: No data recorded   CCA Screening Triage Referral Assessment Type of Contact: Face-to-Face  Telemedicine Service Delivery:   Is this Initial or Reassessment? No data recorded Date Telepsych consult ordered in CHL:  No data recorded Time Telepsych consult ordered in CHL:  No data recorded Location of Assessment: [facility] Assessment Services  Provider Location: [facility] Assessment Services   Collateral Involvement: Clinician attempted to contact pt's legal guardian/uncle, Frederick Cooper, [phone] and pt's aunt, Frederick Cooper, at [phone] was both unsuccessful.   Does Patient Have a Automotive engineer Guardian? No data recorded Name and Contact of Legal Guardian: No data recorded If Minor and Not Living with Parent(s), Who has Custody? N/A  Is CPS involved or ever been involved? -- (Unknown)  Is APS involved or ever been involved? -- (Unknown)   Patient Determined To Be At Risk for Harm To Self or Others Based on Review of Patient Reported Information or Presenting Complaint? Yes, for Self-Harm  Method: No data recorded Availability of Means: No data recorded Intent: No data recorded Notification Required: No data recorded Additional Information for Danger to Others Potential: No data recorded Additional Comments for Danger to Others Potential: No data recorded Are There Guns or Other Weapons in Your Home? No data recorded Types of  Guns/Weapons: No data recorded Are These Weapons Safely Secured?                            No data recorded Who Could Verify You Are Able To Have These Secured: No data recorded Do You Have any Outstanding Charges, Pending Court Dates, Parole/Probation? No data recorded Contacted To Inform of Risk of Harm To Self or Others: -- (N/A)  Does Patient Present under Involuntary Commitment? No  IVC Papers Initial File Date: No data recorded  [place] of Residence: [place]   Patient Currently Receiving the Following Services: Not Receiving Services   Determination of Need: Urgent (48 hours)   Options For Referral: [facility] Urgent Care; Inpatient Hospitalization   Discharge Disposition:     Redmond Pulling, [facility]     Redmond Pulling, MS, [facility], [facility] Triage Specialist [phone]

## 2021-07-04 NOTE — Discharge Instructions (Signed)
  Discharge recommendations:  Patient is to take medications as prescribed. Please see information for follow-up appointment with psychiatry and therapy. Please follow up with your primary care provider for all medical related needs.   Therapy: We recommend that patient participate in individual therapy to address mental health concerns.  Atypical antipsychotics: If you are prescribed an atypical antipsychotic, it is recommended that your height, weight, BMI, blood pressure, fasting lipid panel, and fasting blood sugar be monitored by your outpatient providers.  Safety:  The patient should abstain from use of illicit substances/drugs and abuse of any medications. If symptoms worsen or do not continue to improve or if the patient becomes actively suicidal or homicidal then it is recommended that the patient return to the closest hospital emergency department, the [facility] or call 911 for further evaluation and treatment.  Therapy Walk-in Hours  Monday-Wednesday: 8 AM until slots are full  Friday: 1 PM to 5 PM  For Monday to Wednesday, it is recommended that patients arrive between 7:30 AM and 7:45 AM because patients will be seen in the order of arrival.  For Friday, we ask that patients arrive between 12 PM to 12:30 PM.  Go to the second floor on arrival and check in.  **Availability is limited; therefore, patients may not be seen on the same day.**  Medication management walk-ins:  Monday to Friday: 8 AM to 11 AM.  It is recommended that patients arrive by 7:30 AM to 7:45 AM because patients will be seen in the order of arrival.  Go to the second floor on arrival and check in.  **Availability is limited; therefore, patients may not be seen on the same day.**  National Suicide Prevention Lifeline 1-800-SUICIDE or (281)642-3754.  About 988 988 offers 24/7 access to trained crisis counselors who can help people experiencing mental health-related  distress. People can call or text 988 or chat 988lifeline.org for themselves or if they are worried about a loved one who may need crisis support.

## 2021-07-04 NOTE — ED Provider Notes (Signed)
Behavioral Health Urgent Care Medical Screening Exam  Patient Name: Frederick Cooper MRN: [id number] Date of Evaluation: 07/04/21 Chief Complaint:   Diagnosis:  Final diagnoses:  Adjustment disorder with mixed anxiety and depressed mood    History of Present illness: Frederick Cooper is a 20 y.o. male who presents to [facility] voluntarily. Patient requests assistance for depression and anxiety. 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 Patient denies suicidal  ideations. He denies homicidal ideations. He denies auditory and visual hallucinations. He reports occasional use of marijuana. Denies use of other substances.    *Clinician attempted to call pt's legal guardian/uncle, Frederick Cooper, [phone] clinician was unable to leave a HIPPA voice message.  Pt consented for clinician to aunt his aunt Frederick Cooper, [phone] however the call was unsuccessful, clinician received the message: "call can't be completed as this time please try your call again later."*  Psychiatric Specialty Exam  Presentation  General Appearance:Appropriate for Environment  Eye Contact:Fair  Speech:Clear and Coherent; Normal Rate  Speech Volume:Normal  Handedness:No data recorded  Mood and Affect  Mood:Depressed  Affect:Congruent   Thought Process  Thought Processes:Coherent; Goal Directed  Descriptions of Associations:Intact  Orientation:Full (Time, Place and Person)  Thought Content:Logical  Diagnosis of Schizophrenia or Schizoaffective disorder in past: No   Hallucinations:None  Ideas of Reference:None  Suicidal Thoughts:No  Homicidal Thoughts:No   Sensorium  Memory:Immediate Good  Judgment:Fair  Insight:Fair   Executive Functions  Concentration:Fair  Attention Span:Fair  Recall:Fair  Fund of Knowledge:Fair  Language:Good   Psychomotor Activity   Psychomotor Activity:Normal   Assets  Assets:Communication Skills; Desire for Improvement; Financial Resources/Insurance; Housing; Physical Health; Social Support   Sleep  Sleep:Fair  Number of hours: 11   No data recorded  Physical Exam: Physical Exam Constitutional:      General: He is not in acute distress.    Appearance: He is not ill-appearing, toxic-appearing or diaphoretic.  HENT:     Head: Normocephalic.     Right Ear: External ear normal.     Left Ear: External ear normal.  Eyes:     Pupils: Pupils are equal, round, and reactive to light.  Cardiovascular:     Rate and Rhythm: Normal rate.  Pulmonary:     Effort: Pulmonary effort is normal. No respiratory distress.  Musculoskeletal:        General: Normal range of motion.  Skin:    General: Skin is warm and dry.  Neurological:     Mental Status: He is alert and oriented to person, place, and time.  Psychiatric:        Mood and Affect: Mood is depressed.        Speech: Speech normal.        Behavior: Behavior is cooperative.        Thought Content: Thought content does not include homicidal or suicidal ideation. Thought content does not include suicidal plan.   Review of Systems  Constitutional:  Negative for chills, diaphoresis, fever, malaise/fatigue and weight loss.  HENT:  Negative for congestion.   Respiratory:  Negative for cough and shortness of breath.   Cardiovascular:  Negative for chest pain and palpitations.  Gastrointestinal:  Negative for diarrhea, nausea and vomiting.  Neurological:  Negative for dizziness and seizures.  Psychiatric/Behavioral:  Positive for depression. Negative for hallucinations, memory loss and suicidal ideas. The patient is nervous/anxious and has insomnia.  All other systems reviewed and are negative.  Blood pressure 119/76, pulse 73, temperature 98.2 F (36.8 C), temperature source Oral, resp. rate 18, SpO2 97 %. There is no height or weight on file to calculate  BMI.  Musculoskeletal: Strength & Muscle Tone: within normal limits Gait & Station: normal Patient leans: N/A   [facility] MSE Discharge Disposition for Follow up and Recommendations: Based on my evaluation the patient does not appear to have an emergency medical condition and can be discharged with resources and follow up care in outpatient services for Medication Management and Individual Therapy  Provided with walk-in hours for medication management and therapy.    Jackelyn Poling, NP 07/04/2021, 10:08 PM

## 2021-07-06 ENCOUNTER — Telehealth (HOSPITAL_COMMUNITY): Payer: Self-pay | Admitting: Pediatrics

## 2021-07-06 NOTE — BH Assessment (Addendum)
Care Management - Follow Up [facility] Discharges   Writer attempted to make contact with patient today and was unsuccessful.  Writer was not able to leave a HIPPA compliant voice message.

## 2022-02-01 ENCOUNTER — Ambulatory Visit (HOSPITAL_COMMUNITY)
Admission: EM | Admit: 2022-02-01 | Discharge: 2022-02-01 | Disposition: A | Discharge status: Home / Self Care | Payer: No Payment, Other | Attending: Psychiatry | Admitting: Psychiatry

## 2022-02-01 DIAGNOSIS — F1090 Alcohol use, unspecified, uncomplicated: Secondary | ICD-10-CM | POA: Insufficient documentation

## 2022-02-01 DIAGNOSIS — F1990 Other psychoactive substance use, unspecified, uncomplicated: Secondary | ICD-10-CM

## 2022-02-01 DIAGNOSIS — F840 Autistic disorder: Secondary | ICD-10-CM

## 2022-02-01 DIAGNOSIS — Z653 Problems related to other legal circumstances: Secondary | ICD-10-CM | POA: Insufficient documentation

## 2022-02-01 DIAGNOSIS — Z9151 Personal history of suicidal behavior: Secondary | ICD-10-CM | POA: Insufficient documentation

## 2022-02-01 DIAGNOSIS — R4588 Nonsuicidal self-harm: Secondary | ICD-10-CM | POA: Insufficient documentation

## 2022-02-01 NOTE — Discharge Instructions (Addendum)
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
## 2022-04-10 ENCOUNTER — Emergency Department
Admission: EM | Admit: 2022-04-10 | Discharge: 2022-04-10 | Disposition: A | Discharge status: Home / Self Care | Payer: Medicaid Other | Attending: Emergency Medicine | Admitting: Emergency Medicine

## 2022-04-10 ENCOUNTER — Emergency Department: Payer: Medicaid Other

## 2022-04-10 DIAGNOSIS — M61162 Myositis ossificans progressiva, left lower leg: Secondary | ICD-10-CM | POA: Diagnosis not present

## 2022-04-10 DIAGNOSIS — M79605 Pain in left leg: Secondary | ICD-10-CM | POA: Diagnosis present

## 2022-04-10 DIAGNOSIS — M6150 Other ossification of muscle, unspecified site: Secondary | ICD-10-CM
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
## 2022-04-10 NOTE — ED Triage Notes (Signed)
Ambulatory to triage with C/o left leg pain. Denies injury. Pain onset this month. States he feels sharp stabbing pain in entire leg when he stands. Full ROM to leg in triage. +CMS to affected extremity. NAD at this time.

## 2022-04-10 NOTE — Discharge Instructions (Signed)
-  Please follow-up with the orthopedist listed below these instructions.  -You may continue take over-the-counter medications as needed for pain.  -Return to the emergency department anytime if you begin to experience any new or worsening symptoms
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
